# Patient Record
Sex: Female | Born: 2000 | Race: Black or African American | Hispanic: No | Marital: Single | Smoking: Never smoker
Health system: Southern US, Community
[De-identification: ages and names within clinical notes are randomized; demographics above are authoritative.]

## PROBLEM LIST (undated history)

## (undated) DIAGNOSIS — L309 Dermatitis, unspecified: Secondary | ICD-10-CM

## (undated) DIAGNOSIS — N200 Calculus of kidney: Secondary | ICD-10-CM

## (undated) DIAGNOSIS — J45909 Unspecified asthma, uncomplicated: Secondary | ICD-10-CM

## (undated) DIAGNOSIS — J302 Other seasonal allergic rhinitis: Secondary | ICD-10-CM

## (undated) DIAGNOSIS — L259 Unspecified contact dermatitis, unspecified cause: Secondary | ICD-10-CM

## (undated) HISTORY — PX: OTHER SURGICAL HISTORY: SHX169

## (undated) HISTORY — PX: LITHOTRIPSY: SUR834

---
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
## 2018-12-12 ENCOUNTER — Encounter (HOSPITAL_COMMUNITY): Payer: Self-pay

## 2018-12-12 ENCOUNTER — Ambulatory Visit (HOSPITAL_COMMUNITY)
Admission: EM | Admit: 2018-12-12 | Discharge: 2018-12-12 | Disposition: A | Discharge status: Home / Self Care | Payer: Medicaid Other | Attending: Family Medicine | Admitting: Family Medicine

## 2018-12-12 ENCOUNTER — Other Ambulatory Visit: Payer: Self-pay

## 2018-12-12 DIAGNOSIS — Z113 Encounter for screening for infections with a predominantly sexual mode of transmission: Secondary | ICD-10-CM | POA: Diagnosis present

## 2018-12-12 LAB — HIV ANTIBODY (ROUTINE TESTING W REFLEX): HIV Screen 4th Generation wRfx: NONREACTIVE

## 2018-12-12 NOTE — ED Triage Notes (Signed)
Patient presents to Urgent Care with complaints of needing STD testing since her partner was told by an ex that she may have had syphillis. Patient reports she does use protection but wants testing anyway, pt denies symptoms.

## 2018-12-12 NOTE — Discharge Instructions (Signed)
Will notify you of any positive findings and if any changes to treatment are needed.   You may monitor your results on your MyChart online as well.   Continue to use condoms to prevent STD's.

## 2018-12-12 NOTE — ED Provider Notes (Signed)
[facility]    CSN: [id number] Arrival date & time: 12/12/18  1421      History   Chief Complaint Chief Complaint  Patient presents with  . SEXUALLY TRANSMITTED DISEASE    HPI Cristina Hamilton is a 18 y.o. female.   [employer] presents with concerns about STD exposure, syphilis in particular. States her partner was just notified by one of his previous partners that they had a positive syphilis test, but then was told it was a false positive. Therefore she is uncertain about potential exposure, but would like to be screened. Denies any new vaginal symptoms. No vulvar complaints, no rashes, sores, lesions, lymphadenopathy, fevers. Has had chlamydia in the past. Uses condoms with her current partner. LMP 2 weeks ago, she is on oral birth control. States that her partner is getting screened as well.    ROS per HPI, negative if not otherwise mentioned.      History reviewed. No pertinent past medical history.  There are no active problems to display for this patient.   History reviewed. No pertinent surgical history.  OB History   No obstetric history on file.      Home Medications    Prior to Admission medications   Medication Sig Start Date End Date Taking? Authorizing Provider  clobetasol cream (TEMOVATE) 0.05 % Apply topically. 08/12/18 08/12/19 Yes [provider]  Norethindrone-Ethinyl Estradiol-Fe Biphas (LO LOESTRIN FE) 1 MG-10 MCG / 10 MCG tablet Take 1 tablet by mouth daily. 07/29/16  Yes [provider]  albuterol (PROVENTIL) (2.5 MG/3ML) 0.083% nebulizer solution Inhale 3 mLs into the lungs every 6 (six) hours as needed for wheezing or shortness of breath.     [provider]  beclomethasone (QVAR) 40 MCG/ACT inhaler Inhale 2 puffs into the lungs 2 (two) times daily.    [provider]  cetirizine (ZYRTEC) 10 MG tablet Take 10 mg by mouth daily. 07/22/16   [provider]  cycloSPORINE (RESTASIS) 0.05  % ophthalmic emulsion Place 1 drop into both eyes 2 (two) times daily. 09/14/15   [provider]  EPINEPHrine (EPIPEN 2-PAK) 0.3 mg/0.3 mL IJ SOAJ injection Inject 0.3 mg as directed once. For severe allergic reaction. 01/11/14   [provider]  fluticasone (FLONASE) 50 MCG/ACT nasal spray Place 1-2 sprays into both nostrils daily as needed for allergies. 11/03/13   [provider]  fluticasone (FLOVENT HFA) 44 MCG/ACT inhaler Inhale 2 puffs into the lungs 2 (two) times daily. 07/23/16   [provider]  mometasone (ELOCON) 0.1 % ointment Apply 1 application topically as needed (to affected area).  06/06/16   [provider]  naproxen (NAPROSYN) 500 MG tablet Take 500 mg by mouth 2 (two) times daily. 11/13/16   [provider]  ondansetron (ZOFRAN ODT) 4 MG disintegrating tablet Take 1 tablet (4 mg total) by mouth every 8 (eight) hours as needed for nausea or vomiting. 10/01/17   Liberty Handy, PA-C  tamsulosin (FLOMAX) 0.4 MG CAPS capsule Take 1 capsule (0.4 mg total) by mouth daily. 10/01/17   Liberty Handy, PA-C    Family History Family History  Problem Relation Age of Onset  . Healthy Mother     Social History Social History   Tobacco Use  . Smoking status: Never Smoker  . Smokeless tobacco: Never Used  Substance Use Topics  . Alcohol use: Not Currently  . Drug use: Not Currently     Allergies   Patient has  no known allergies.   Review of Systems Review of Systems   Physical Exam Triage Vital Signs ED Triage Vitals  Enc Vitals Group     BP 12/12/18 1458 116/84     Pulse Rate 12/12/18 1458 97     Resp 12/12/18 1458 16     Temp 12/12/18 1458 99.2 F (37.3 C)     Temp Source 12/12/18 1458 Oral     SpO2 12/12/18 1458 100 %     Weight --      Height --      Head Circumference --      Peak Flow --      Pain Score 12/12/18 1456 0     Pain Loc --      Pain Edu? --      Excl. in [place]? --    No data found.   Updated Vital Signs BP 116/84 (BP Location: Right Arm)   Pulse 97   Temp 99.2 F (37.3 C) (Oral)   Resp 16   SpO2 100%    Physical Exam Constitutional:      General: She is not in acute distress.    Appearance: She is well-developed.  Cardiovascular:     Rate and Rhythm: Normal rate.  Pulmonary:     Effort: Pulmonary effort is normal.  Abdominal:     Palpations: Abdomen is soft. Abdomen is not rigid.     Tenderness: There is no abdominal tenderness. There is no guarding or rebound.  Genitourinary:    Comments: Denies sores, lesions, vaginal bleeding; no pelvic pain; gu exam deferred at this time, vaginal self swab collected.   Skin:    General: Skin is warm and dry.  Neurological:     Mental Status: She is alert and oriented to person, place, and time.      UC Treatments / Results  Labs (all labs ordered are listed, but only abnormal results are displayed) Labs Reviewed  RPR  HIV ANTIBODY (ROUTINE TESTING W REFLEX)  CERVICOVAGINAL ANCILLARY ONLY    EKG   Radiology No results found.  Procedures Procedures (including critical care time)  Medications Ordered in UC Medications - No data to display  Initial Impression / Assessment and Plan / UC Course  I have reviewed the triage vital signs and the nursing notes.  Pertinent labs & imaging results that were available during my care of the patient were reviewed by me and considered in my medical decision making (see chart for details).     Std screening collected and pending. Will notify of any positive findings and if any changes to treatment are needed.  Safe sex encouraged. Patient verbalized understanding and agreeable to plan.   Final Clinical Impressions(s) / UC Diagnoses   Final diagnoses:  Screen for STD (sexually transmitted disease)     Discharge Instructions     Will notify you of any positive findings and if any changes to treatment are needed.   You may monitor your results on your MyChart  online as well.   Continue to use condoms to prevent STD's.    ED Prescriptions    None     I have reviewed the PDMP during this encounter.   Zigmund Gottron, NP 12/12/18 1521

## 2018-12-13 LAB — RPR: RPR Ser Ql: NONREACTIVE

## 2018-12-17 LAB — CERVICOVAGINAL ANCILLARY ONLY
Bacterial vaginitis: NEGATIVE
Candida vaginitis: POSITIVE — AB
Chlamydia: NEGATIVE
Neisseria Gonorrhea: NEGATIVE
Trichomonas: NEGATIVE

## 2018-12-18 ENCOUNTER — Telehealth (HOSPITAL_COMMUNITY): Payer: Self-pay | Admitting: Emergency Medicine

## 2018-12-18 ENCOUNTER — Encounter (HOSPITAL_COMMUNITY): Payer: Self-pay

## 2018-12-18 MED ORDER — FLUCONAZOLE 150 MG PO TABS: 150.0000 mg | ORAL_TABLET | Freq: Once | ORAL | 0 refills | Status: AC

## 2018-12-18 NOTE — Telephone Encounter (Signed)
Test for candida (yeast) was positive.  Prescription for fluconazole 150mg po now, repeat dose in 3d if needed, #2 no refills, sent to the pharmacy of record.  Recheck or followup with PCP for further evaluation if symptoms are not improving.    Attempted to reach patient. No answer at this time. Voicemail left.     

## 2018-12-18 NOTE — Telephone Encounter (Signed)
Pt returned my call, informed her of positive yeast and medication at pharmacy. Verbalized understanding, all questions answered.

## 2019-06-14 ENCOUNTER — Encounter (HOSPITAL_COMMUNITY): Payer: Self-pay

## 2019-06-14 ENCOUNTER — Ambulatory Visit (HOSPITAL_COMMUNITY)
Admission: EM | Admit: 2019-06-14 | Discharge: 2019-06-14 | Disposition: A | Discharge status: Home / Self Care | Payer: BC Managed Care – PPO | Attending: Emergency Medicine | Admitting: Emergency Medicine

## 2019-06-14 ENCOUNTER — Other Ambulatory Visit: Payer: Self-pay

## 2019-06-14 DIAGNOSIS — T7840XA Allergy, unspecified, initial encounter: Secondary | ICD-10-CM

## 2019-06-14 HISTORY — DX: Other seasonal allergic rhinitis: J30.2

## 2019-06-14 HISTORY — DX: Calculus of kidney: N20.0

## 2019-06-14 HISTORY — DX: Dermatitis, unspecified: L30.9

## 2019-06-14 HISTORY — DX: Unspecified asthma, uncomplicated: J45.909

## 2019-06-14 HISTORY — DX: Unspecified contact dermatitis, unspecified cause: L25.9

## 2019-06-14 MED ORDER — MONTELUKAST SODIUM 10 MG PO TABS: 10.0000 mg | ORAL_TABLET | Freq: Every day | ORAL | 0 refills | Status: AC

## 2019-06-14 MED ORDER — HYDROCORTISONE 2.5 % EX OINT: TOPICAL_OINTMENT | Freq: Two times a day (BID) | CUTANEOUS | 0 refills | Status: AC | PRN

## 2019-06-14 NOTE — ED Provider Notes (Signed)
HPI  SUBJECTIVE:  Cristina Hamilton is a 19 y.o. female who presents with a pruritic urticarial facial rash yesterday.  She states that the rash is improving, but woke up this morning with her left eye swollen shut.  No rash elsewhere.  No fevers, wheezing, shortness of breath, cough, tongue or lip swelling, eye pain, visual changes.  No new or different lotions, soaps, detergents.  No new foods, no recent change in medications.  No recent antibiotics.  She has tried mometasone ointment on the rash and Benadryl.  The mometasone helped the urticarial rash.  No aggravating factors.  She has had similar symptoms before with sporadic urticaria on her face.  It usually lasts about 10 minutes and then resolves.  She states that the breakouts are coming more frequently.  She has a past medical history of contact dermatitis, asthma, seasonal allergies for which she takes Xyzal daily, eczema.  No history of diabetes.  LMP: Last week.  Denies the possibility of being pregnant.  PMD and allergist: In [place].  Past Medical History:  Diagnosis Date  . Asthma   . Contact dermatitis   . Eczema   . Kidney stones   . Seasonal allergies     Past Surgical History:  Procedure Laterality Date  . laser surg for kidney stone Left   . LITHOTRIPSY      Family History  Problem Relation Age of Onset  . Healthy Mother     Social History   Tobacco Use  . Smoking status: Never Smoker  . Smokeless tobacco: Never Used  Substance Use Topics  . Alcohol use: Not Currently  . Drug use: Not Currently    No current facility-administered medications for this encounter.  Current Outpatient Medications:  .  levocetirizine (XYZAL) 5 MG tablet, Take 5 mg by mouth every evening., Disp: , Rfl:  .  albuterol (PROVENTIL) (2.5 MG/3ML) 0.083% nebulizer solution, Inhale 3 mLs into the lungs every 6 (six) hours as needed for wheezing or shortness of breath. , Disp: , Rfl:  .  beclomethasone (QVAR) 40 MCG/ACT inhaler,  Inhale 2 puffs into the lungs 2 (two) times daily., Disp: , Rfl:  .  cetirizine (ZYRTEC) 10 MG tablet, Take 10 mg by mouth daily., Disp: , Rfl:  .  clobetasol cream (TEMOVATE) 0.05 %, Apply topically., Disp: , Rfl:  .  EPINEPHrine (EPIPEN 2-PAK) 0.3 mg/0.3 mL IJ SOAJ injection, Inject 0.3 mg as directed once. For severe allergic reaction., Disp: , Rfl:  .  fluticasone (FLONASE) 50 MCG/ACT nasal spray, Place 1-2 sprays into both nostrils daily as needed for allergies., Disp: , Rfl:  .  fluticasone (FLOVENT HFA) 44 MCG/ACT inhaler, Inhale 2 puffs into the lungs 2 (two) times daily., Disp: , Rfl:  .  hydrocortisone 2.5 % ointment, Apply topically 2 (two) times daily as needed for up to 14 days., Disp: 30 g, Rfl: 0 .  mometasone (ELOCON) 0.1 % ointment, Apply 1 application topically as needed (to affected area). , Disp: , Rfl:  .  montelukast (SINGULAIR) 10 MG tablet, Take 1 tablet (10 mg total) by mouth at bedtime., Disp: 30 tablet, Rfl: 0 .  naproxen (NAPROSYN) 500 MG tablet, Take 500 mg by mouth 2 (two) times daily., Disp: , Rfl:  .  Norethindrone-Ethinyl Estradiol-Fe Biphas (LO LOESTRIN FE) 1 MG-10 MCG / 10 MCG tablet, Take 1 tablet by mouth daily., Disp: , Rfl:  .  tamsulosin (FLOMAX) 0.4 MG CAPS capsule, Take 1 capsule (0.4 mg total) by mouth  daily., Disp: 30 capsule, Rfl: 0  No Known Allergies   ROS  As noted in HPI.   Physical Exam  BP 114/75   Pulse (!) 102   Temp 98 F (36.7 C) (Oral)   Resp 18   Ht 4\' 9"  (1.448 m)   Wt 59 kg   SpO2 95%   BMI 28.13 kg/m   Constitutional: Well developed, well nourished, no acute distress Eyes: PERRLA, EOMI, conjunctiva normal bilaterally, no pain with EOMs.  Positive mild nontender periorbital swelling, erythema.  No direct or consensual photophobia      HENT: Normocephalic, atraumatic,mucus membranes moist Respiratory: Normal inspiratory effort Cardiovascular: Normal rate GI: nondistended skin: No facial rash, skin  intact Musculoskeletal: no deformities Neurologic: Alert & oriented x 3, no focal neuro deficits Psychiatric: Speech and behavior appropriate   ED Course   Medications - No data to display  No orders of the defined types were placed in this encounter.   No results found for this or any previous visit (from the past 24 hour(s)). No results found.  ED Clinical Impression  1. Allergic reaction, initial encounter      ED Assessment/Plan . Patient shows me pictures of urticarial rash over her face.  She has has a picture of right-sided periorbital swelling from this morning, and it seems to have improved since.  No evidence of periorbital or post septal cellulitis.  There is no facial urticaria now.  States that there is no urticaria elsewhere -pt declined further skin exam.  We discussed oral steroids, however I think that we can get this under control with topical hydrocortisone butyrate.  She is currently on an appropriate medication for urticaria.  will add Singulair.  Primary care list for ongoing care.  Patient states that she lives here now.  Discussed MDM, treatment plan, and plan for follow-up with patient. . patient agrees with plan.   Meds ordered this encounter  Medications  . montelukast (SINGULAIR) 10 MG tablet    Sig: Take 1 tablet (10 mg total) by mouth at bedtime.    Dispense:  30 tablet    Refill:  0  . hydrocortisone 2.5 % ointment    Sig: Apply topically 2 (two) times daily as needed for up to 14 days.    Dispense:  30 g    Refill:  0    *This clinic note was created using . Therefore, there may be occasional mistakes despite careful proofreading.   ?    Scientist, clinical (histocompatibility and immunogenetics), MD 06/15/19 [phone]

## 2019-06-14 NOTE — ED Triage Notes (Addendum)
Pt states she woke up yesterday with her right eye half way shut due to swelling and bumps on her face. Pt not sure exactly what caused her allergic reaction, but she states she has a lot of allergies. Pt still has some raised bumps on right side of face, but not as many as yesterday. Pt states not itching right now, but it was more itchy yesterday.

## 2019-06-14 NOTE — Discharge Instructions (Addendum)
Hydrocortisone is safe to use around your eye.  Use only if necessary.  I am adding Singulair to help keep your allergies under better control since the Xyzal does not seem to be doing it 100%.  I am giving you two allergy practices that you can follow-up with.  Below is a list of primary care practices who are taking new patients for you to follow-up with.  [facility] internal medicine clinic Ground Floor - [facility], [address] [place], [place], [place] [address] [phone]  [facility] [address] [place], [place] [address] [phone]  [facility] [address] [place], [place] [address] [phone]  [facility] [phone] [address] [place] [address]  [facility]: [address] [place] [address]  [phone]  [facility]: [address] [place] [address]   [phone]  [facility]: [address] [place] [address]  [phone]  [place] [facility] : [address] [place] [address] [phone]  [facility]: [address] [place] [address] [phone]  [facility]: [address] [place] [address] [phone]  Dr. Oneal Grout [address] [facility] [place] [address]  [phone]  Dr. Jackie Plum, [facility]. [address] [place], [place] [address]  [phone]  Go to www.goodrx.com to look up your medications. This will give you a list of where you can find your prescriptions at the most affordable prices. Or ask the pharmacist what the cash price is, or if they have any other discount programs available to help make your medication more  affordable. This can be less expensive than what you would pay with insurance.

## 2019-11-09 ENCOUNTER — Other Ambulatory Visit (HOSPITAL_COMMUNITY): Payer: Self-pay | Admitting: Urology

## 2019-11-09 ENCOUNTER — Other Ambulatory Visit: Payer: Self-pay | Admitting: Urology

## 2019-11-09 DIAGNOSIS — N281 Cyst of kidney, acquired: Secondary | ICD-10-CM

## 2019-11-09 DIAGNOSIS — N200 Calculus of kidney: Secondary | ICD-10-CM

## 2019-11-18 ENCOUNTER — Ambulatory Visit (HOSPITAL_COMMUNITY)
Admission: RE | Admit: 2019-11-18 | Discharge: 2019-11-18 | Disposition: A | Discharge status: Home / Self Care | Payer: BC Managed Care – PPO | Source: Ambulatory Visit | Attending: Urology | Admitting: Urology

## 2019-11-18 ENCOUNTER — Other Ambulatory Visit: Payer: Self-pay

## 2019-11-18 ENCOUNTER — Encounter (HOSPITAL_COMMUNITY): Payer: Self-pay

## 2019-11-18 DIAGNOSIS — N281 Cyst of kidney, acquired: Secondary | ICD-10-CM

## 2019-11-18 DIAGNOSIS — N200 Calculus of kidney: Secondary | ICD-10-CM

## 2019-11-18 MED ORDER — GADOBUTROL 1 MMOL/ML IV SOLN
6.5000 mL | Freq: Once | INTRAVENOUS | Status: AC | PRN
  Administered 2019-11-18: 6.5 mL via INTRAVENOUS

## 2020-12-02 ENCOUNTER — Other Ambulatory Visit: Payer: Self-pay

## 2020-12-02 DIAGNOSIS — J45909 Unspecified asthma, uncomplicated: Secondary | ICD-10-CM | POA: Insufficient documentation

## 2020-12-02 DIAGNOSIS — Y92410 Unspecified street and highway as the place of occurrence of the external cause: Secondary | ICD-10-CM | POA: Insufficient documentation

## 2020-12-02 DIAGNOSIS — H9192 Unspecified hearing loss, left ear: Secondary | ICD-10-CM | POA: Diagnosis not present

## 2020-12-02 DIAGNOSIS — Z79899 Other long term (current) drug therapy: Secondary | ICD-10-CM | POA: Insufficient documentation

## 2020-12-02 DIAGNOSIS — R519 Headache, unspecified: Secondary | ICD-10-CM | POA: Diagnosis present

## 2020-12-03 ENCOUNTER — Encounter (HOSPITAL_BASED_OUTPATIENT_CLINIC_OR_DEPARTMENT_OTHER): Payer: Self-pay

## 2020-12-03 ENCOUNTER — Emergency Department (HOSPITAL_BASED_OUTPATIENT_CLINIC_OR_DEPARTMENT_OTHER)
Admission: EM | Admit: 2020-12-03 | Discharge: 2020-12-03 | Disposition: A | Discharge status: Home / Self Care | Payer: Medicaid Other | Attending: Emergency Medicine | Admitting: Emergency Medicine

## 2020-12-03 DIAGNOSIS — H9192 Unspecified hearing loss, left ear: Secondary | ICD-10-CM
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
## 2021-08-20 IMAGING — MR MR ABDOMEN WO/W CM
18 series · 48 of 48 positions shown · IV contrast (gadavist)
Comparison: Multiple exams, including CT examination from
11/18/2019

CLINICAL DATA: Renal lesion workup.

EXAM:
MRI ABDOMEN WITHOUT AND WITH CONTRAST
TECHNIQUE: Multiplanar multisequence MR imaging of the abdomen was performed
both before and after the administration of intravenous contrast.
CONTRAST:  6.5mL GADAVIST GADOBUTROL 1 MMOL/ML IV SOLN

[Series 2: T2 · coronal · 6.0mm · 1.56mm/px · 2 of 24 slices shown (1 of 2)]
[im 1/24]
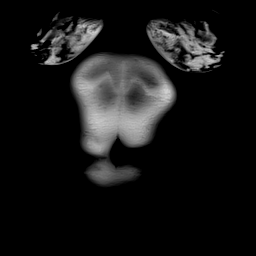
[im 24/24]
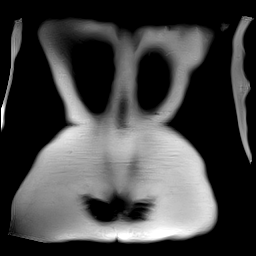

[Series 5: T2 fat-sat · axial · 6.0mm · 1.25mm/px · z∈[-103,+149]mm · 2 of 36 slices shown]
[im 1/36]
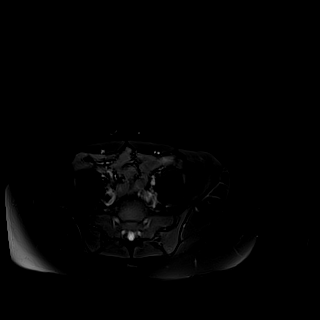
[im 36/36]
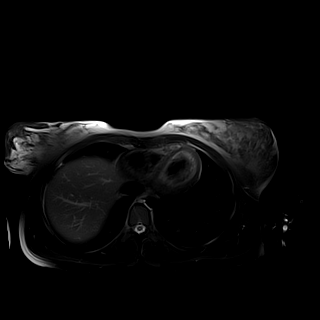

[Series 6: DWI · axial · 6.0mm · 1.49mm/px · z∈[-101,+151]mm · 4 of 72 slices shown (1 of 2)]
[im 1/72]
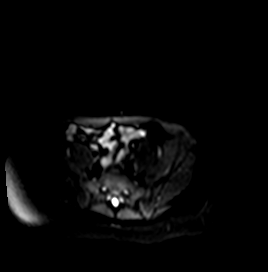
[im 24/72]
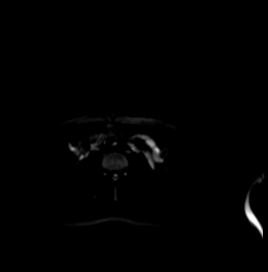
[im 48/72]
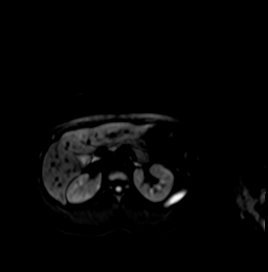
[im 72/72]
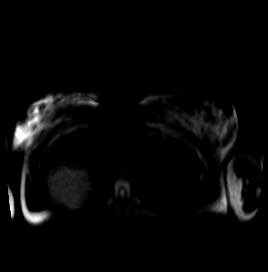

[Series 7: DWI · axial · 6.0mm · 1.49mm/px · z∈[-101,+151]mm · 2 of 36 slices shown (2 of 2)]
[im 1/36]
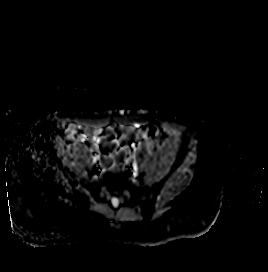
[im 36/36]
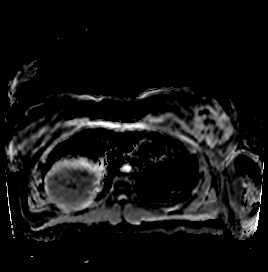

[Series 8: T1 · axial · 3.0mm · 1.25mm/px · z∈[-60,+153]mm · 3 of 72 slices shown (1 of 2)]
[im 1/72]
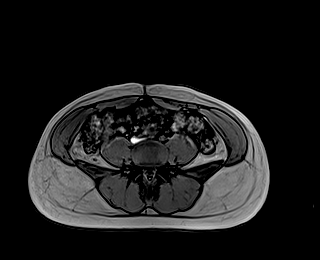
[im 36/72]
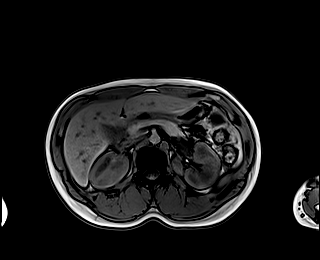
[im 72/72]
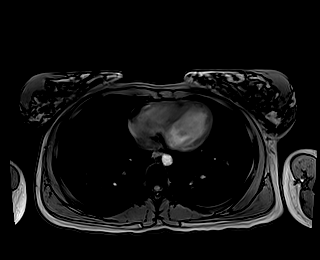

[Series 9: T1 · axial · 3.0mm · 1.25mm/px · z∈[-60,+153]mm · 3 of 72 slices shown (2 of 2)]
[im 1/72]
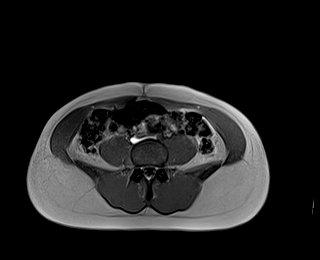
[im 36/72]
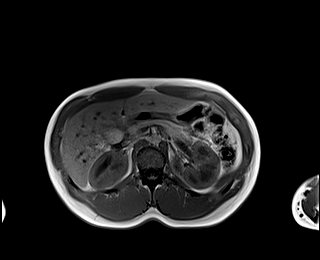
[im 72/72]
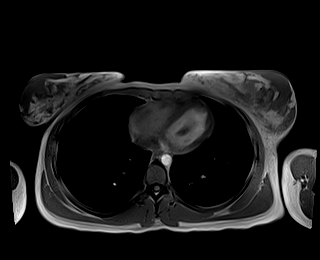

[Series 10: bSSFP · axial · 5.0mm · 0.84mm/px · z∈[-68,+146]mm · 2 of 40 slices shown]
[im 1/40]
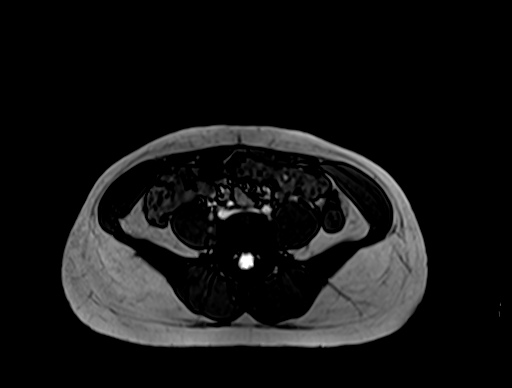
[im 40/40]
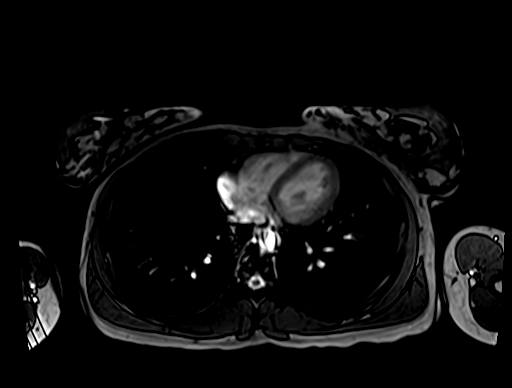

[Series 12: T1 dynamic · axial · 3.0mm · 1.25mm/px · z∈[-93,+144]mm · 3 of 80 slices shown (1 of 10)]
[im 1/80]
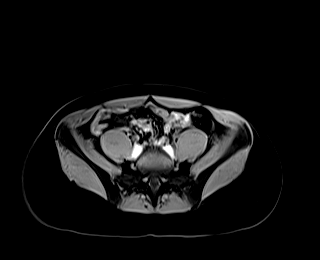
[im 40/80]
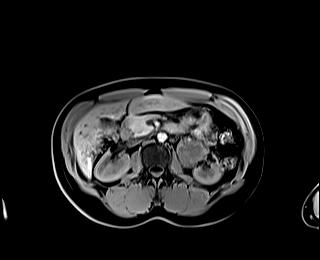
[im 80/80]
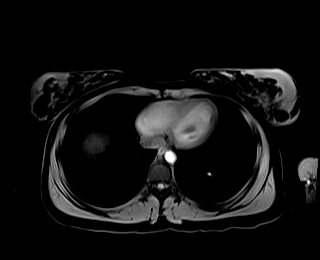

[Series 16: T1 dynamic · axial · 3.0mm · 1.25mm/px · z∈[-93,+144]mm · 3 of 80 slices shown (2 of 10)]
[im 1/80]
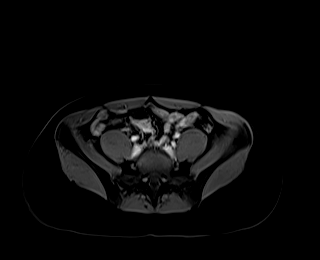
[im 40/80]
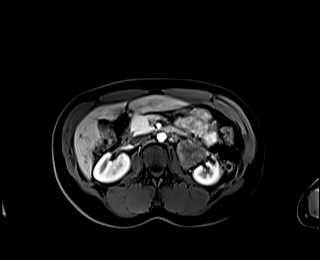
[im 80/80]
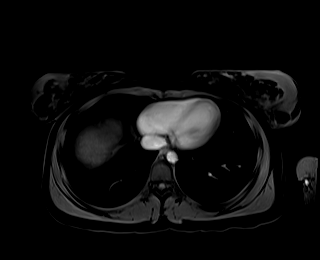

[Series 17: T1 dynamic · axial · 3.0mm · 1.25mm/px · z∈[-93,+144]mm · 3 of 80 slices shown (3 of 10)]
[im 1/80]
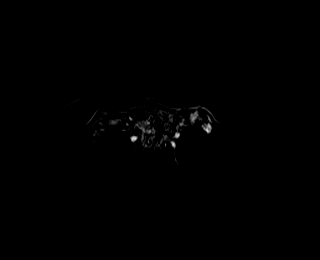
[im 40/80]
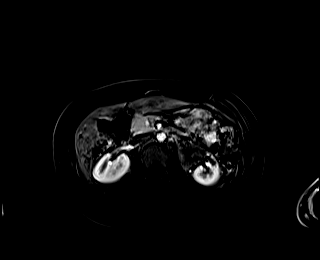
[im 80/80]
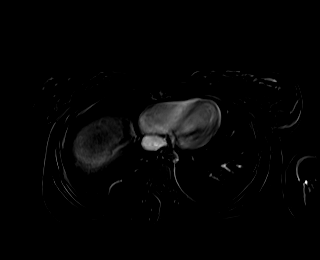

[Series 20: T1 dynamic · axial · 3.0mm · 1.25mm/px · z∈[-93,+144]mm · 3 of 80 slices shown (4 of 10)]
[im 1/80]
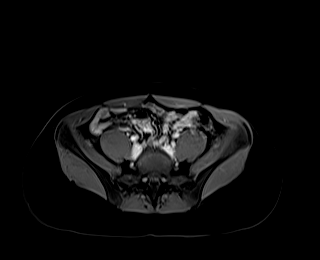
[im 40/80]
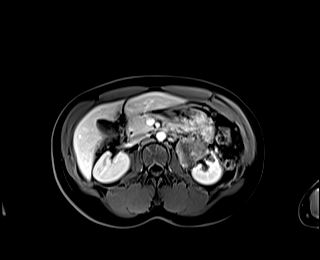
[im 80/80]
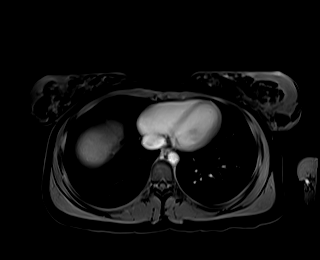

[Series 21: T1 dynamic · axial · 3.0mm · 1.25mm/px · z∈[-93,+144]mm · 3 of 80 slices shown (5 of 10)]
[im 1/80]
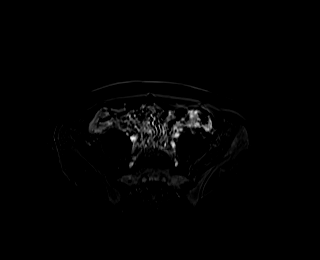
[im 40/80]
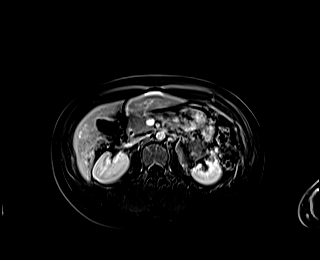
[im 80/80]
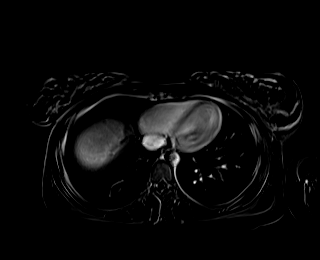

[Series 24: T1 dynamic · axial · 3.0mm · 1.25mm/px · z∈[-93,+144]mm · 3 of 80 slices shown (6 of 10)]
[im 1/80]
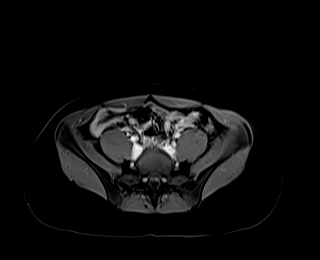
[im 40/80]
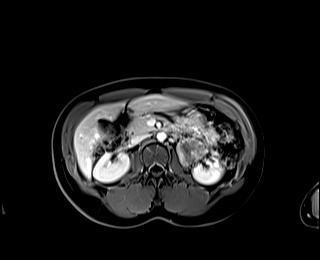
[im 80/80]
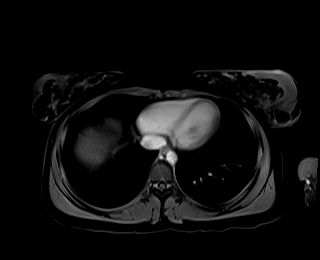

[Series 25: T1 dynamic · axial · 3.0mm · 1.25mm/px · z∈[-93,+144]mm · 3 of 80 slices shown (7 of 10)]
[im 1/80]
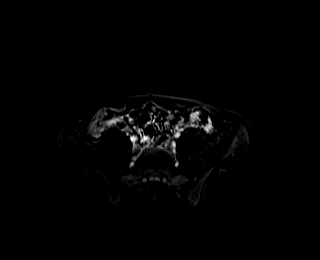
[im 40/80]
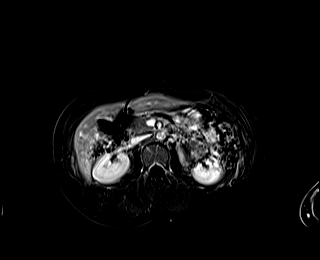
[im 80/80]
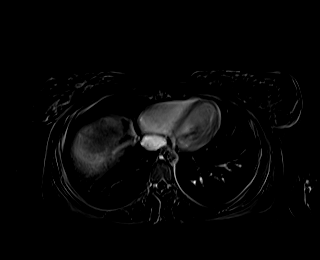

[Series 27: T1 dynamic · coronal · 3.0mm · 1.41mm/px · 2 of 52 slices shown (8 of 10)]
[im 1/52]
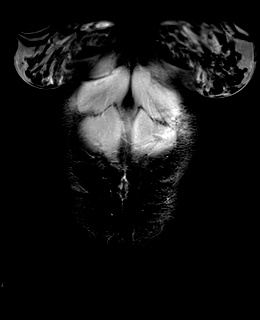
[im 52/52]
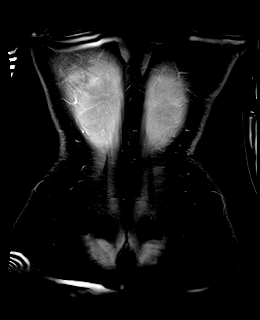

[Series 28: T2 · axial · 6.0mm · 1.56mm/px · 1 of 30 slices shown (2 of 2)]
[im 1/30]
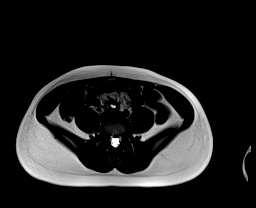

[Series 31: T1 dynamic · axial · 3.0mm · 1.25mm/px · z∈[-93,+144]mm · 3 of 80 slices shown (9 of 10)]
[im 1/80]
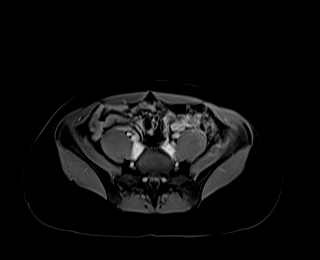
[im 40/80]
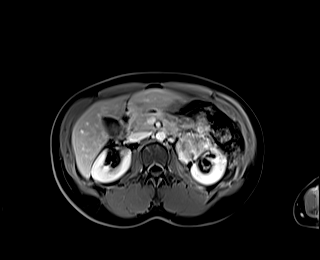
[im 80/80]
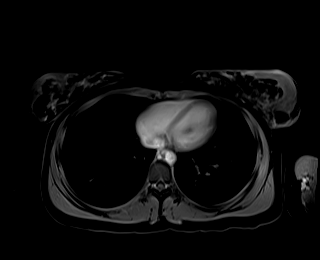

[Series 32: T1 dynamic · axial · 3.0mm · 1.25mm/px · z∈[-93,+144]mm · 3 of 80 slices shown (10 of 10)]
[im 1/80]
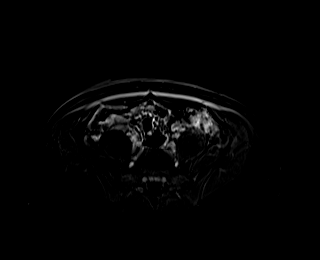
[im 40/80]
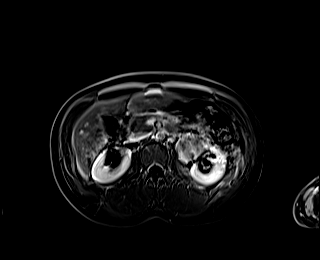
[im 80/80]
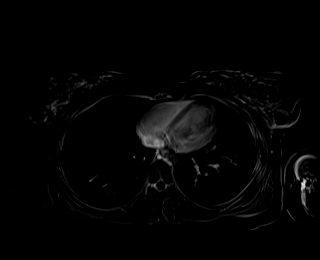

[48 of 48 positions shown; findings below may reference images not displayed]

FINDINGS: Lower chest: Unremarkable

Hepatobiliary: Unremarkable

Pancreas:  Unremarkable

Spleen:  Unremarkable

Adrenals/Urinary Tract: Along the left kidney lower pole renal hilum
in just below the left renal vein, a 4.6 by 3.6 by 5.0 cm
hypoenhancing mass is present, with intermediate to low T2 signal
characteristics and intermediate T1 signal characteristics, but
demonstrating progressive and especially peripheral enhancement
which increases on the later phase images. No internal fatty content
identified. As noted on CT, there is an and extension of this
process or adjacent lymph node measuring about 2.4 by 1.3 cm on
image 45 of series 6, this also demonstrates primarily peripheral
enhancement. The patient has known nonobstructive renal calculi
based on today's CT examination which was performed without IV
contrast.

Stomach/Bowel: Prominent stool throughout the colon favors
constipation.

Vascular/Lymphatic: 0.8 cm left periaortic lymph node on image 22 of
series 5.

Other:  No supplemental non-categorized findings.

Musculoskeletal: Unremarkable
IMPRESSION: 1. Enhancing mass just below the left renal vein and partially along
the left kidney lower pole renal sinus. My sense is that the renal
vein is patent in that this lesion primarily displaces the
collecting system and proximal ureter rather than arising from the
ureter, although the relationship of the collecting system with this
mass is complex. I am skeptical of a thrombosed aneurysm given the
pattern of enhancement which is primarily peripheral and progressive
over time. The lesion does not enhance as homogeneously as would be
typical for lymphoma. Many forms of malignancy are less common in
this age group, and the lesion has only mildly increased in size
over the last 2 years. Possibilities may include mesenchymal tumor,
renal cell carcinoma, transitional cell carcinoma, unusual
postinflammatory pseudotumor, metastatic lesion, or localized
lymphoproliferative disease. Tissue diagnosis is likely warranted
unless this has been previously worked up.
2.  Prominent stool throughout the colon favors constipation.
